# Patient Record
Sex: Male | Born: 2000 | Hispanic: No
Health system: Western US, Academic
[De-identification: ages and names within clinical notes are randomized; demographics above are authoritative.]

---
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
## 2020-04-17 ENCOUNTER — Other Ambulatory Visit (HOSPITAL_COMMUNITY): Payer: Self-pay

## 2020-04-18 ENCOUNTER — Other Ambulatory Visit (HOSPITAL_COMMUNITY): Payer: Self-pay

## 2020-04-19 ENCOUNTER — Other Ambulatory Visit (HOSPITAL_COMMUNITY): Payer: Self-pay

## 2020-04-20 ENCOUNTER — Other Ambulatory Visit (HOSPITAL_COMMUNITY): Payer: Self-pay

## 2020-04-23 ENCOUNTER — Other Ambulatory Visit (HOSPITAL_COMMUNITY): Payer: Self-pay

## 2020-04-29 ENCOUNTER — Other Ambulatory Visit (HOSPITAL_COMMUNITY): Payer: Self-pay

## 2020-05-03 ENCOUNTER — Other Ambulatory Visit (HOSPITAL_COMMUNITY): Payer: Self-pay

## 2020-05-12 ENCOUNTER — Other Ambulatory Visit (HOSPITAL_COMMUNITY): Payer: Self-pay

## 2020-05-12 IMAGING — CT CT Brain W-O Contrast
3 of 4 series · 16 of 47 positions shown, 19 images · non-contrast
Comparison: [DATE].

CT Brain W-O Contrast
INDICATION: TBI (traumatic brain injury) TBI (traumatic brain injury).                   
 Pertinent History: Follow up for head injury with bleed                                   
 Surgical History:craniotomy                                                               
 Cancer: None                                                                              
 Technologist Comments: None.
TECHNIQUE: Helical acquisition with sagittal and coronal reformats.                       
 Utilized dose reduction techniques include: Vendor specific iterative                     
 reconstruction technique

[Series 2: head 3mm stnd · axial · 0.49mm/px · z∈[-114,+28]mm · 10 of 55 slices shown, 13 images]
[im 4/55  brain]
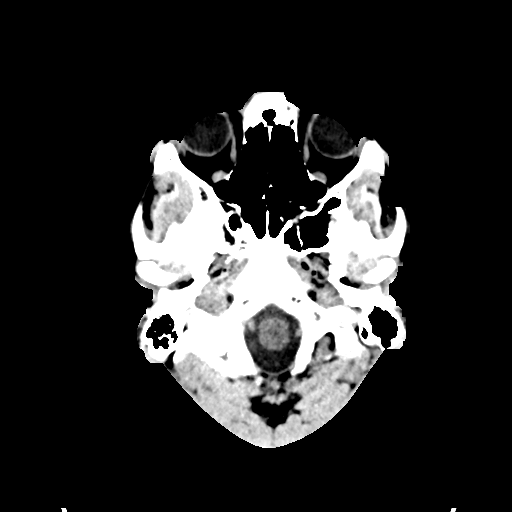
[im 4/55  bone]
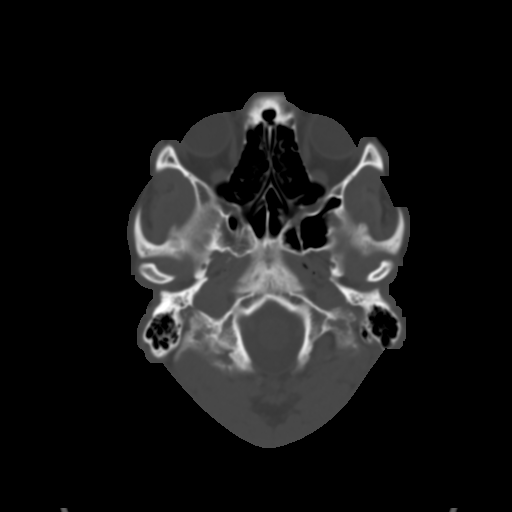
[im 8/55  brain]
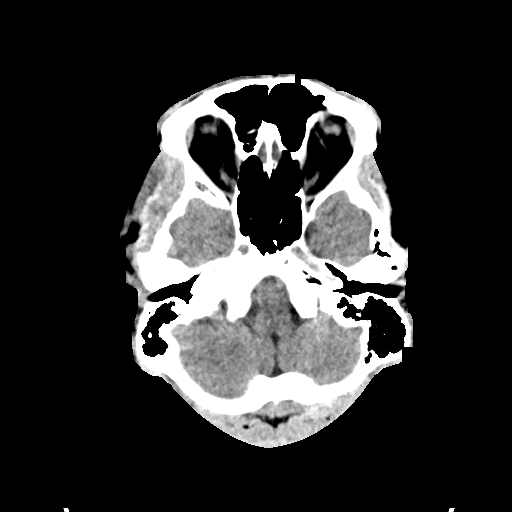
[im 16/55  brain]
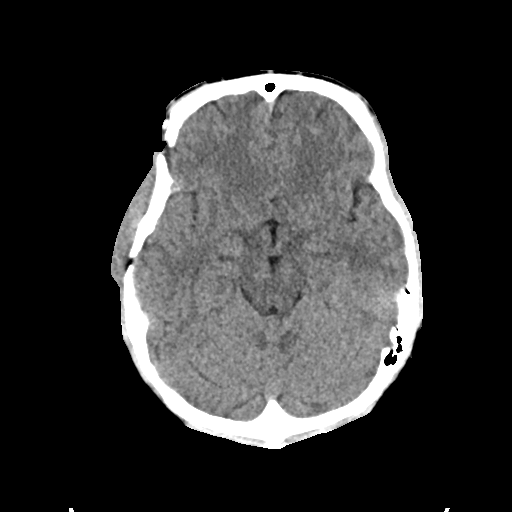
[im 20/55  brain]
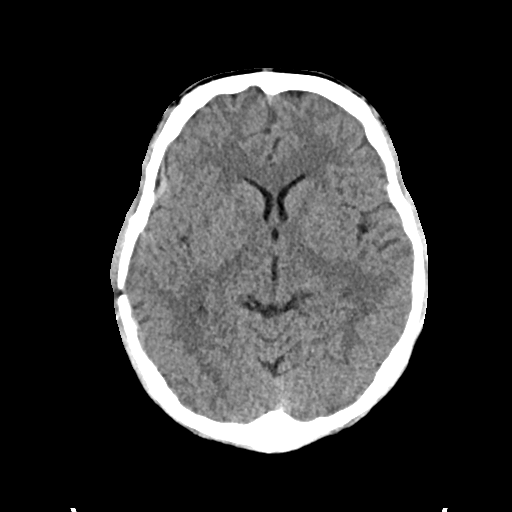
[im 24/55  brain]
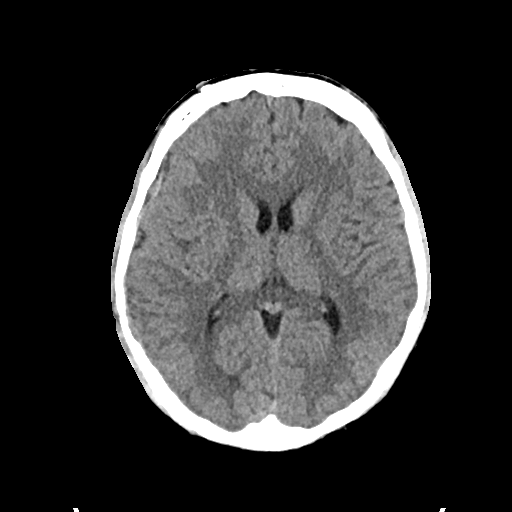
[im 24/55  bone]
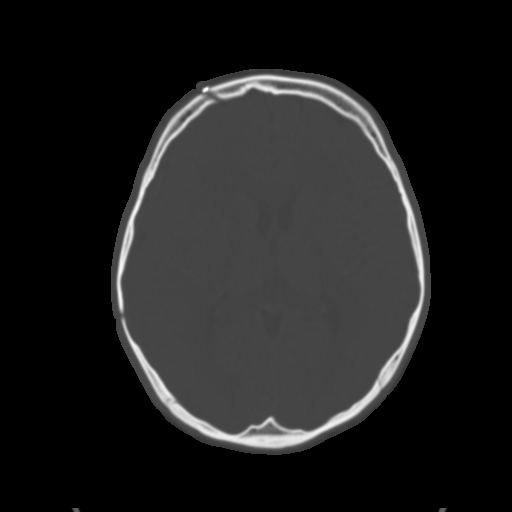
[im 31/55  brain]
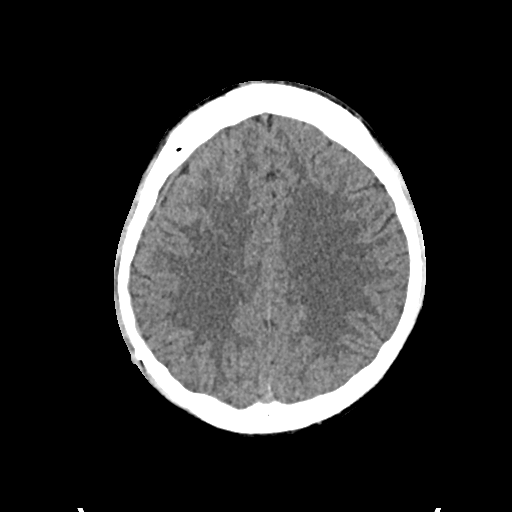
[im 35/55  brain]
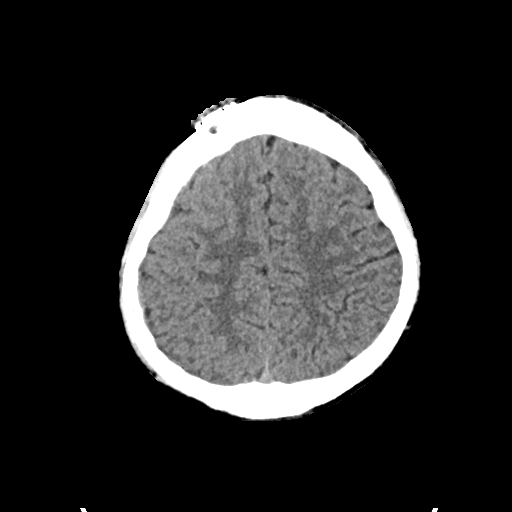
[im 39/55  brain]
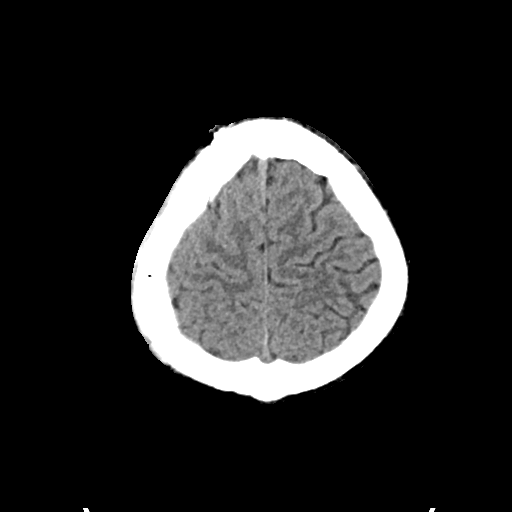
[im 47/55  brain]
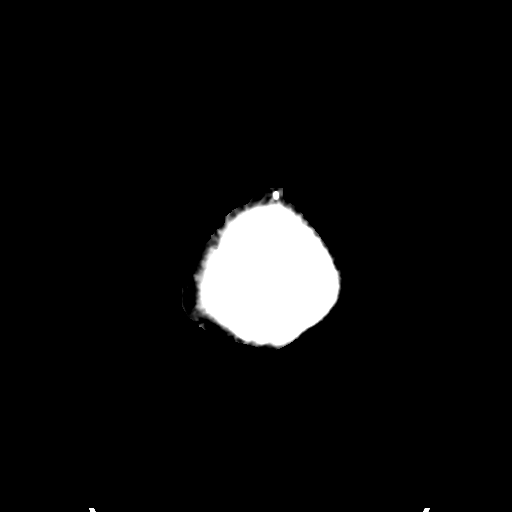
[im 47/55  bone]
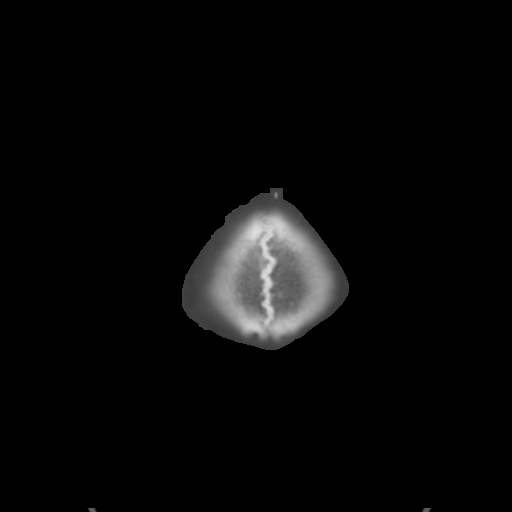
[im 51/55  brain]
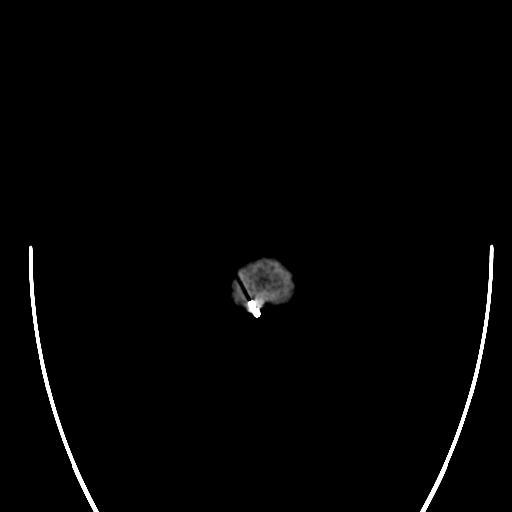

[Series 4: cor 3x3 · coronal · 0.49mm/px · 3 of 73 slices shown]
[im 25/73  brain]
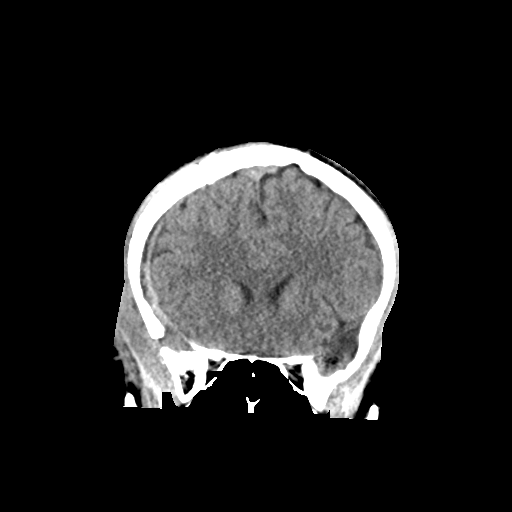
[im 33/73  brain]
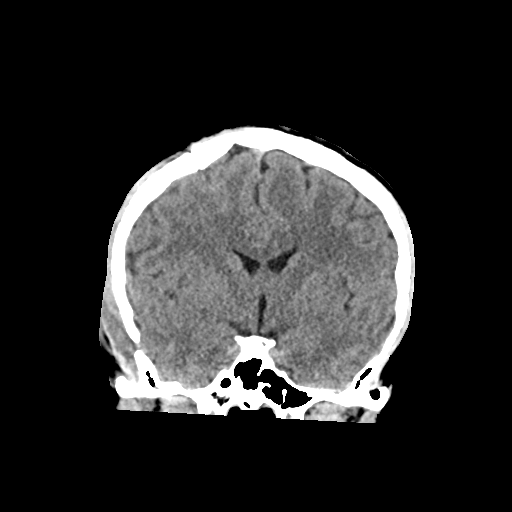
[im 41/73  brain]
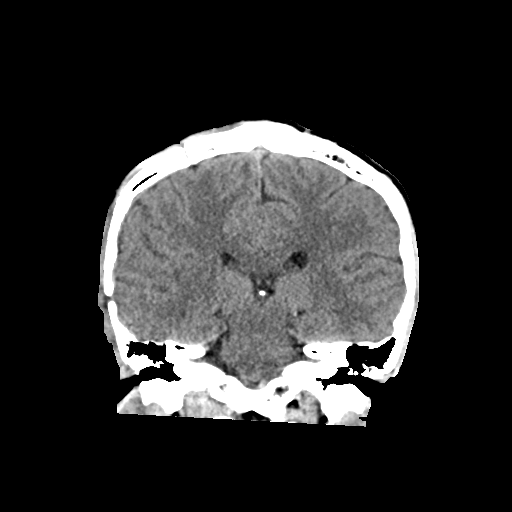

[Series 5: sag 3x3 · sagittal · 0.49mm/px · 3 of 65 slices shown]
[im 22/65  brain]
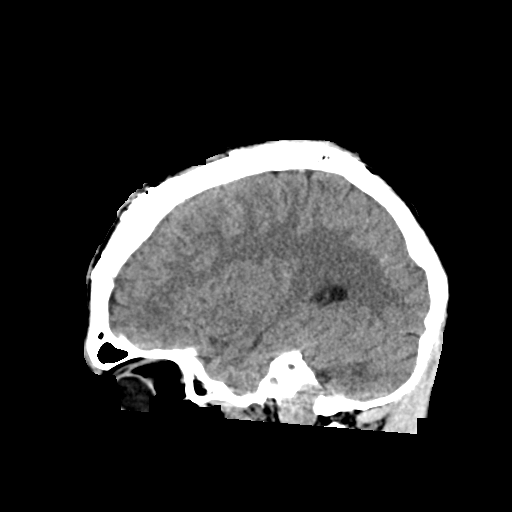
[im 33/65  brain]
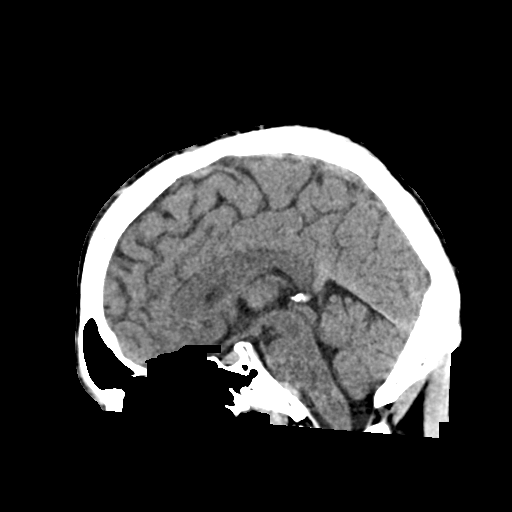
[im 43/65  brain]
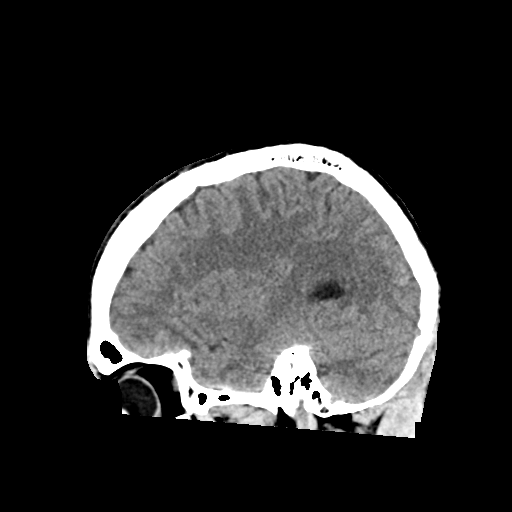

[16 of 47 positions shown; findings below may reference images not displayed]

FINDINGS: There is no significant midline shift. The ventricles and sulci are             
 normal in size for age. The basal cisterns are patent. No acute mass or mass              
 effect is seen. There is no CT evidence of an acute large territory infarct.              
 There is no acute intracranial hemorrhage.                                                
 There is a trace amount of fluid within the dependent portion of the imaged               
 right mastoid. The paranasal sinuses demonstrate mild mucosal thickening within           
 a ethmoid air cell. The left mastoid air cells are clear.                                 
 There has been a right frontotemporoparietal craniotomy. There is                         
 improved                                                                                  
 pneumocephalus. There is soft tissue swelling at the right scalp, this may be             
 slightly increased. The right sided drains have been removed. The a small amount          
 of right extra-axial hemorrhage is again noted. This is best seen on image #24,           
 series 6.
IMPRESSION: No acute findings.

## 2020-05-14 ENCOUNTER — Telehealth (HOSPITAL_BASED_OUTPATIENT_CLINIC_OR_DEPARTMENT_OTHER): Payer: Self-pay

## 2020-05-14 NOTE — Telephone Encounter (Signed)
RETURN CALL: Voicemail - Detailed Message      SUBJECT:  General Message     MESSAGE: Patients Dr. Richardson Chiquito from [facility] [place], [place]. Sent a referral for patient 03.17.2022 for overseeing patients therapy. Patients mother wants to know has the referral been received in office? Patient's Family is requesting Dr. Vicenta Aly

## 2020-05-14 NOTE — Telephone Encounter (Signed)
RETURN CALL: Voicemail - Detailed Message      SUBJECT:  General Message     MESSAGE: patients mother called to inquire about his referral to Neurosurgery wanting to know the soonest he can get scheduled? She would like to see Dr. Erby Pian if possible.

## 2020-05-18 NOTE — Telephone Encounter (Addendum)
Pt's mom is calling to see about scheduling per referral.    Pt's mother also said Dr. Mammie Lorenzo sent a referral directly to Dr. Yehuda Mao to see this patient. Referral sent 05/16/20/    [facility] unable to appoint as pt's symptoms are not listed in PST.     Please call pt's mother Lorene Dy [phone].

## 2020-05-18 NOTE — Telephone Encounter (Signed)
RETURN CALL: Voicemail - Detailed Message      SUBJECT: Urgent per caller   2nd request   Appointment Request     REASON FOR VISIT:     Unspecified intracranial injury with loss of consciousness of unspecified duration, initial encounter    PREFERRED DATE/TIME: asap    ADDITIONAL INFORMATION:     As per mother, they need to schedule asap as they are running into a time issue and are hoping to have an appointment by 06/17/2020 with Dr Jana Hakim.    Thank you

## 2020-05-19 NOTE — Telephone Encounter (Signed)
No patient contact made. Scheduling instructions were added to the referral for [facility] or clinic schedulers.    Faxed request for additional information to be faxed for review. Pt already scheduled for therapy at [facility]. No records available in care everywhere.

## 2020-05-19 NOTE — Telephone Encounter (Signed)
RETURN CALL: Voicemail - General Message      SUBJECT:  General Message     MESSAGE:       Lorene Dy mother requesting to know if Ty received the images.      Thank you

## 2020-05-24 ENCOUNTER — Telehealth (HOSPITAL_BASED_OUTPATIENT_CLINIC_OR_DEPARTMENT_OTHER): Payer: Self-pay

## 2020-05-24 NOTE — Telephone Encounter (Signed)
RETURN CALL: Voicemail - General Message      SUBJECT:  General Message     MESSAGE: Pt's mother is leaving a message for Ty to find out if images that were being mailed were received, and if the appointment with Dr. Erby Pian will be scheduled for tomorrow as anticipated.

## 2020-05-25 ENCOUNTER — Telehealth: Payer: Self-pay

## 2020-05-25 NOTE — Telephone Encounter (Signed)
RETURN CALL: Voicemail - Detailed Message      SUBJECT:  General Message     MESSAGE: Patients mom called asking for Ty to call her back.    Patients mom has spoken with him about tentative appointment today and wants to know if it is confirmed.

## 2020-05-26 ENCOUNTER — Encounter (HOSPITAL_BASED_OUTPATIENT_CLINIC_OR_DEPARTMENT_OTHER): Payer: Self-pay | Admitting: Unknown Physician Specialty

## 2020-05-26 ENCOUNTER — Ambulatory Visit: Payer: PRIVATE HEALTH INSURANCE | Attending: Unknown Physician Specialty | Admitting: Unknown Physician Specialty

## 2020-05-26 ENCOUNTER — Other Ambulatory Visit (HOSPITAL_COMMUNITY): Payer: Self-pay

## 2020-05-26 ENCOUNTER — Encounter (HOSPITAL_BASED_OUTPATIENT_CLINIC_OR_DEPARTMENT_OTHER): Payer: Self-pay | Admitting: Rehabilitative and Restorative Service Providers"

## 2020-05-26 DIAGNOSIS — S069X9S Unspecified intracranial injury with loss of consciousness of unspecified duration, sequela: Secondary | ICD-10-CM | POA: Insufficient documentation

## 2020-05-26 DIAGNOSIS — X58XXXD Exposure to other specified factors, subsequent encounter: Secondary | ICD-10-CM | POA: Insufficient documentation

## 2020-05-26 DIAGNOSIS — S069X9D Unspecified intracranial injury with loss of consciousness of unspecified duration, subsequent encounter: Secondary | ICD-10-CM | POA: Insufficient documentation

## 2020-05-26 NOTE — Telephone Encounter (Signed)
error 

## 2020-05-26 NOTE — Progress Notes (Signed)
[facility]  Portions of this chart may have been created with speech recognition software.    Distant Site Telemedicine Encounter    I conducted this encounter from My Home via secure, live, face-to-face video conference with the patient. Derek Elliott was located at Home alone.  I reviewed the risks and benefits of telemedicine as pertinent to this visit and the patient agreed to proceed.    CC: Head injury  Referred by: Mammie Lorenzo, MD  Accompanied by: none  History obtained from: Patient, review of EMR     HPI: Derek Elliott is a 20 year old LHD male who presents to clinic for evaluation of a head injury.    Date: 04/17/20  Mechanism: Wrestling - details unclear   Immediate neurologic symptoms: Found down by roommates, brought to [facility]  Initial imaging findings: Right subdural hemorrhage with midline shift, extra-axial hemorrhage, left anterior temporal and frontal lobe hemorrhagic contusion, tSAH, s/p decompressive craniectomy, s/p cranioplasty 3/2  PTA: 1-2 days     Reports he is pretty much back to his baseline.     Mobility/dizziness/balance: Denies issues   Cognition: No issues. He has been doing 7-8 hours of cognitive work per day, working with several startups.   Sleep: Sleeping well from ~ 12am-9am  Pain: Denies headaches   Activity: Activity precautions from Neurosurgeons for another 3 weeks. He is tolerating walking, going up/down stairs.   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 On medical leave.     Denies weakness, numbness, tingling. Initially had some L hand tingling but improving.     Prior head injuries: None     Social History: Currently in [place], [place] staying with his parents   Past Medical History: Denies      Outpatient Medications Prior to Visit   Medication Sig Dispense Refill    Cetirizine HCl (ZYRTEC ALLERGY) 10 MG Oral Tab Take 10 mg by mouth daily as needed for allergies.       No  facility-administered medications prior to visit.       Review of patient's allergies indicates:  Allergies   Allergen Reactions    Kiwi Extract Skin: Hives       Physical Exam:  Gen: NAD, comfortable  Pulm: Non-labored respirations, on room air, normal effort  Skin: No visible rashes or lesions  Psych: Cooperative, appropriate, pleasant     Neuro: Alert, speech is fluent with normal comprehension, no dysarthria, aphasia or dysphonia  Registration: Able to repeat 5/5 words after 1 trial.    Digit span: Repeated 5 digits forward, 3 digits backwards  Sustained attention: Serial 7s correct x5, listed months of the year backwards with 1 error, listed days of the week backwards correctly  Delayed recall: 4 of 5 words after 5 minutes.  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 He has recovered very well. He  denies symptoms today and feels at his baseline. He has upcoming visits with PT, OT and SLP. Anticipate he will be ready to return to school in the fall. Do not think he will need neuropsychological evaluation but will discuss his progress with therapy team. He did not have any other concerns for me today.     Follow up: as needed     Dolores Hoose, MD

## 2020-05-27 ENCOUNTER — Encounter (HOSPITAL_BASED_OUTPATIENT_CLINIC_OR_DEPARTMENT_OTHER): Payer: Self-pay | Admitting: Rehabilitative and Restorative Service Providers"

## 2020-05-27 ENCOUNTER — Ambulatory Visit
Payer: PRIVATE HEALTH INSURANCE | Attending: Rehabilitative and Restorative Service Providers" | Admitting: Speech-Language Pathologist

## 2020-05-27 ENCOUNTER — Encounter (HOSPITAL_BASED_OUTPATIENT_CLINIC_OR_DEPARTMENT_OTHER): Payer: Self-pay

## 2020-05-27 ENCOUNTER — Ambulatory Visit (HOSPITAL_BASED_OUTPATIENT_CLINIC_OR_DEPARTMENT_OTHER): Payer: PRIVATE HEALTH INSURANCE | Admitting: Rehabilitative and Restorative Service Providers"

## 2020-05-27 DIAGNOSIS — S069XAA Unspecified intracranial injury with loss of consciousness status unknown, initial encounter: Secondary | ICD-10-CM | POA: Insufficient documentation

## 2020-05-27 DIAGNOSIS — Z789 Other specified health status: Secondary | ICD-10-CM | POA: Insufficient documentation

## 2020-05-27 DIAGNOSIS — S069X9S Unspecified intracranial injury with loss of consciousness of unspecified duration, sequela: Secondary | ICD-10-CM

## 2020-05-27 DIAGNOSIS — R41841 Cognitive communication deficit: Secondary | ICD-10-CM | POA: Insufficient documentation

## 2020-05-27 DIAGNOSIS — Z7409 Other reduced mobility: Secondary | ICD-10-CM | POA: Insufficient documentation

## 2020-05-27 DIAGNOSIS — X58XXXD Exposure to other specified factors, subsequent encounter: Secondary | ICD-10-CM | POA: Insufficient documentation

## 2020-05-27 NOTE — Progress Notes (Signed)
[facility] PHYSICAL THERAPY INITIAL PLAN OF CARE          L & I Claim #: N/A    Certification From*: 05/28/20  Certification To*: 06/27/20  ___ Discontinue Therapy Services    Date of Symptom Onset*: 04/17/20  Start of Care Date*: 05/28/20  VISITS FROM SOC: 1  TOTAL DSHS UNITS TO DATE: 0    Referring Provider: Janese Banks MD; now followed by Dolores Hoose MD  Interpreter Status: Not Needed    Reason for Referral*: TBI  History of Present Illness: 20 y/o male found down by roommates and brought to the hospital 04/17/20 where he was found to have R subdural hemorrhage with midline shift, extraaxial hemorrhage, left anterior temporal and frontal hemorrhagic contusions, tSAH s/p decompressive craniectomy and eventual cranioplasty 04/28/20.  Pertinent Medical/Surgical History:   No past medical history on file.  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 He has an internship that starts June 4th and is a few blocks away.  Current / Past Rehabilitation: [facility] in [place]  Prior Level of Function  Prior Function Comments*: Independent with mobility and ADL's     Precautions:    Precautions: Lifting 10lb restriction    , no intense exercise until April 20th.                  Fall Screening:  Do you worry about falling?: No  Have you fallen in the past year?: Yes  Do you feel unsteady when standing or walking?: No    SUBJECTIVE:  Patients Statement: Fayette reports that he was having headaches up until about a week ago. 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 He's spending about 8 hours working on the computer. He has been walking up and down stairs at home and feels like he's deconditioned. He has been playing "indoor basketball" with his younger brother. For the first few weeks after his injury he had some numbness and tingling in a median nerve distribution on his left hand. He is left handed. Now he feels like he's about 90% recovered.   Pain Score: 0 - No pain  Patient  Stated Goals: Be able to return to driving, lifting weights, running, exercising at a high level, eventually return to sports    OBJECTIVE:  Vital signs: In sitting BP 137/80 HR 100    Range of Motion: Grossly WFL    Posture: Slight forward head with rounded shoulders in resting position    Strength:    L R   Shoulder flexion 5 5   Shoulder abduction 5 5   Elbow flexion 5 5   Elbow extension 5 5   Hip flexion 5 5   Knee extension 5 5   Knee flexion 5 5   Ankle dorsiflexion 5 5   Ankle plantarflexion 5 (able to do 10 full heel raises) 5 (able to do 10 full heel raises)     Sensation: Mild hypersensitivity in median nerve distribution on left hand; otherwise no impairment    Coordination: Finger-to-nose normal, rapid alternating movements at BUE and BLE, heel-to-shin normal bilaterally    Tone: No hypertonicity noted     Sitting Balance: No impairment    Standing Balance: no impairment observed    Vestibular:  Pt reports initially feeling some difficulty with blurry vision with prolonged screen time but denies that currently    Special Tests: HiMAT: HIGH LEVEL MOBILITY ASSESSMENT TOOL  ITEM  PERFORMANCE POINTS   WALK 3.6s 4   WALK BACKWARD 3.81s 4   WALK ON TOES 4.82s 4   WALK OVER OBSTACLE 4.25s 4   RUN 2.24s 2   SKIP 3.81s 3   HOP FORWARD (AFFECTED) 3.25s 4   BOUND (AFFECTED) 1) 203 cm 4   BOUND (LESS-AFFECTED) 1) 228 cm 4   UP STAIRS DEPENDENT (Rail OR not reciprocal: if not, score 5 and rate below)  4   UP STAIRS INDEPENDENT (No rail AND reciprocal: if not score 0 and rate above) 4.12s (converted from 11 steps) 5   DOWN STAIRS DEPENDENT (Rail OR not reciprocal: if not score 5 and rate below)  4   DOWN STAIRS INDEPENDENT (No rail AND reciprocal: if not score 0 and rate above) 3.57s (converted from 11 steps) 5     TOTAL HiMAT SCORE: 51/54      HiMat Normative Data:    Healthy 77-25 year olds: Cloria Spring al, 2009; n = 45; age range = 18-25)  50-54 points for males (Median score of 54 points)   44-54 points for  females (Median score of 51 points)   Moderate effect size between males and females (r = 0.59)    Transfers: Independent    Ambulation:    Distance: >100'   Level of Assist: Independent with no device   Deviations: No impairment observed   10-meter speed: 3.6 seconds (2.78 m/s)    Stairs: Up/down without handrail independently via reciprocal pattern    Equipment:  None      INTERVENTION:    Evaluation Code 19622 x 50 minutes    Intervention 1- Therapeutic activities x 10 minutes:   Discussed with Gregary Signs and his mother recommendations for returning to exercise once cleared by neurosurgeon. Advised him to perform cardiovascular exercise at a moderate intensity with guideline that he be able to carry on a conversation still. Also discussed recommendation to not worsen any symptoms (fatigue, headache, eye strain, etc) by more than 2 points on a 10 point scale. Finally, edu to [place] and Lorene Dy that for every day pt spends in a hospital bed, he should expect 3 days to return to that level of endurance.    Education:    Primary learner: patient and his mother, Lorene Dy  Preferred learning style:  demonstration, verbal and self-directed practice  Barriers to learning: none   Cultural practices that influence care: none   Topic taught: Role of PT, discharge recommendations, return to exercise parameters   Response to education: [place] and Lorene Dy verbalized understanding       ASSESSMENT: Pt presents with the following:  Comorbidities and Personal Factors affecting plan of care: None  Impairments: Decreased activity tolerance  Activity Limitations: None identified  Participation Restrictions: 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 Claxton Levitz is a 20 year old man referred to [facility] following a TBI sustained in February 2022. Christoph has  made a remarkable recovery thus far and his High level Mobility Assessment Tool score of 51/54 is within normal limits for a man of his age. His only noted impairment is decreased activity tolerance but this is not surprising given his month long hospital course. Assessment of his strength, range of motion, coordination and reaction time do not reveal any concerns from a physical mobility standpoint to him  returning to driving but Art is aware that this final recommendation must come from a physician. At this time, Terel does not have any PT need and will be discharged with the recommendations for a gradual resumption of physical activity once cleared to do so.     Fall risk based on assessment: none     Rehabilitation potential is: Excellent    GOALS:  No PT goals at this time. Pt will be discharged and he and his mother are aware and in agreement with this plan.    Donnamarie Rossetti PT DPT NCS  Physical Therapist  [facility]  [facility]    Physician's Statement of Concurrence:  By signing, I have reviewed and concur with the above therapy evaluation.

## 2020-05-27 NOTE — Progress Notes (Signed)
Derek Elliott PLAN OF CARE/DISCHARGE  L&I Claim #:  N/A    Certification From*: 77/41/28  Certification To*: 78/67/67   ___Discontinue Therapy Services    Date of Symptom Onset*: 04/17/20  Start of Care Date*: 05/27/20  Visits from [facility]:  1    Referring Provider:  Bryon Lions, MD; now followed by Derek Portela, MD  Interpreter Status: Not Needed  Pt accompanied to appt by parents Derek Elliott and Derek Elliott.     Reason for Referral*: Cognitive communication  History of Present Illness:  20 y/o male found down by roommates and brought to the hospital where he was found to have R subdural hemorrhage with midline shift, extraaxial hemorrhage, left anterior temporal and frontal hemorrhagic contusions, tSAH s/p decompressive craniectomy and eventual cranioplasty 04/28/20.  Pertinent Medical/Surgical History:  No past medical history on file.  Background/Social History:  Attending school at [employer]; staying with parents in [place].   Education: Sophomore in [place]: works in [employer]:  Acute care therapies; now followed by [name] PT/OT.   Prior Level of Function:  Independent      Precautions:    Precautions: Neurosurgery precautions; see Dr. Marilynne Elliott note         Fall Screening:  Have you fallen in the past year?:  (Scheduled for PT)      SUBJECTIVE:  Patients Statement:  Pt reported feeling largely back to baseline. Noted his energy has improved "tremendously" and his vision is improving (occasional blurred vision with screen time but tested at PCP and was 20/20). He noted he can work for 2 hours at a time, take a 15 minute break and return to work. 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   Pain Score:  (No pain reported)  Patient's Stated Goals:  Return to school; return to  driving.      OBJECTIVE:  Behavioral Observations:  Pleasant and cooperative  Auditory Comprehension:  WNL in conversation and for following task instructoins  Motor Speech:  WNL  Intelligibility:  100%    Voice:  WNL  Verbal Expression:  WNL  Verbal Fluency: RBANS-b Semantic Fluency = 20 (34%ile; distracted mid-task by dad coming into the office)  Reading Comprehension:  Adequate for FAVRES task  Written Expression:  Adequate for FAVRES task   Pragmatic Communication:  WNL  Memory:  RBANS-b List Learning =31 (53%ile); Story Memory = 15 (11%ile); List Recall = 6 (21%ile); List Recognition = 19/20 (13%ile); Story Recall = 5 (<1%ile); Figure Recall = 20 (91-92%ile)  Executive-Functions:  FAVRES Task 2: Accuracy = 4 (17%ile; pt explained that he had calculated delivery time into project completion but was able to self-correct error)  Problem Solving:  See above  Attention:  RBANS-b Digit Span = 12 (55%ile); Coding = 65 (84%ile)  Visual-Spatial Skills:  RBANS-b Figure Copy = 20 (75%ile); Line Orientation = 19 (77%ile)      Education:   Primary learner:  Patient and parents   Preferred learning style:  written and verbal   Barriers to learning:  none   Cultural practice that influence care:  none   Topic taught:    o Results of evaluation, plan of care  o Discussion re: free online courses to maximize targeting of auditory attention/memory for  school  o Discussed benefits/drawbacks of completion of summer internship, defer to pt/family to make that decision knowing strengths and challenges    Response to education:  Verbalized understanding       ASSESSMENT:  Pt presented with mild deficits in immediate recall of cohesive story and lower than anticipated scores on delayed recall of list and story tasks. He scored high on visual recall which is baseline reported strength for him. At this time he is challenging himself by working with four different startups and expressed interest in free online summer course as  well. He has no current goals with SLP but is encouraged to return if difficulties arise in the future as he continues to progress his cognitive home program.      GOALS:    Discharge Goals:  Pt will verbalize understanding of results of evaluation and POC. MET      The above goals and plan of care have been discussed and agreed upon by the patient and his parents.      PLAN:  Discharge from [facility].       Derek [place], MS, [place]  Speech Pathology  [facility])  Department of Rehabilitation Medicine

## 2020-05-27 NOTE — Progress Notes (Signed)
[facility] OCCUPATIONAL THERAPY INITIAL PLAN OF CARE          L & I Claim #: N/A    Certification From*: 05/27/20  Certification To*: 05/27/20  _X_ Discontinue Therapy Services    Date of Symptom Onset*: 04/17/20  Start of Care Date*: 05/27/20  VISITS FROM [facility]: 1  TOTAL [facility] UNITS TO DATE: 0    Referring Provider: Dolores Hoose, MD  Interpreter Status: Not Needed    Reason for Referral*: OT evaluation and management  History of Present Illness: Derek Elliott is a 20 year old LHD male who presents to clinic for evaluation of a head injury.    Date: 04/17/20  Mechanism: Wrestling - details unclear   Immediate neurologic symptoms: Found down by roommates, brought to [facility] in [facility]  Initial imaging findings: Right subdural hemorrhage with midline shift, extra-axial hemorrhage, left anterior temporal and frontal lobe hemorrhagic contusion, tSAH, s/p decompressive craniectomy, s/p cranioplasty 3/2    Pertinent Medical/Surgical History:   No past medical history on file.  Social History: Pt. Is a sophomore at [employer], IT sales professional. Pt. Currently staying with his parents here in his parents house in [place], [place].  Current / Past Rehabilitation: Received Acute therapies in [place].  Prior Level of Function  Level of Independence: Independent with ADLs and functional transfers;Independent with homemaking with ambulation  Lives With: Other (Comment) (Pt. 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 Express that he is back to baseline and would like to know if this session can address his  ability to return to drive.     Patient Stated Goals: Pt. Stated that he would like to be able to return to school and return to drive    OBJECTIVE:  Posture: WFL    Integumentary: Grossly Intact    Tone: B UE: WFL    Sensation: Pt.'s sensation is grossly intact but there is mild hypersensitivity on the tips of his left thumb and index fingers    Range of Motion: B UE: WNL    Strength: B UE: 5/5    Hand Dominance: Left Hand dominant    Grip/Pinch:  Per clinical observation: WFL    Coordination: Per clinical observation: WFL    Functional Mobility: Independent  Toilet Transfer: Solicitor: Independent  Endurance: Good    Equipment: None    Cognition/Visual Perceptual  Attention: WFL  Orientation: Alert and Oriented x 3  Memory: Not formally assessed due to time constraint. Please see SLP notes.  Executive Functions: Not formally assessed due to time constraint. Please see SLP notes.  Vision: [facility]  Visual Perception: WFL      Basic Activities of Daily Living   Eating: Independent  Grooming: Independent  Bathing: Independent  UB Dressing: Independent  LB Dressing: Independent  Toileting: Independent      Instrumental Activities of Daily Living  Medication Management: Supervision   Meal Preparation: Currently assisted by his parents  Grocery Shopping: Currently assisted by his parents  Cleaning/Laundry: Currently assisted by his parents  Money Management: Independent  Phone:Independent  Reading/Writing: Tour manager Use: Independent  Driving/Transportation:currently assisted by his parents    OT Driver Off-Road Assessment Battery (OT-DORA)    The OT-DORA is a screening tool to evaluate if an individual is appropriate for an on-road driving test. The screen is used as an efficient tool to assess an individuals cognitive, perceptual, behavioral, physical, and sensory skills and abilities that are related to driving, prior to an on-road assessment.      Score Criterion    Trails A 20 secs Avg  Time: 29 seconds  Deficient: >758seconds   Trails B 53 secs Avg Time: 75 seconds  Deficient: >273 seconds    Road Sign Recognition 15/15    Road [employer] Craft Test 30/30 A score <20 indicates client may have difficulty knowing road laws and having road craft skills   Drive Home Maze 15 secs Clients who take longer than 100 seconds to complete task may have difficulties successfully completing on-road assessments   Bells Test 35/35 More than 3 omissions is cause for suspected attentional deficit. Greater than 6 omisisons is cause for suspected visual neglect.    Right Heel Pivot Test 6 secs >8 seconds suggest reduced heel pivot skill that could affect driving.    Simulated Accelerator-Brake Test 0 One or more error suggests client may be at risk of using the right foot to accelerate and left foot to brake in tandem or simultaneously during driving.          INTERVENTION:    Evaluation Code 92119 x 15 minutes    Intervention 1- Self-Care x 45 minutes:   -Completed OT DORA. See details above.  -Initiated Visual Screen using part of the BIVABA:  A. Red Dot Confrontation Test: WNL  B. Smooth Pursuits: WNL  C. 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 Has no major physical impairments noted during this evaluation.  Activity Limitations: Pt. Is currently limited to complete some IADLs such as driving and return to work due to recent injury.  Participation Restrictions: Pt. 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  Level of modification of task or assistance needed: None  Clinical Decision Making Complexity:Low  Pt will not benefit from outpatient OT at this time since Pt. Demonstrate that he is performing within functional limits with his BADLs and IADLs at home. Pt. Demonstrate no risk safety factors with return to driving based on the off road driving screening tool using the OT-DORA completed today. Pt. Will be discharged from OT skilled services.    Rehabilitation potential is: Excellent    Potential Barriers to achieve rehab goals: None    Care Connections Functional Index Score: Outcome Score:  (N/A)       GOALS:    The above goals and plan of care have been discussed and agreed upon by the patient and his family.    PLAN:  Anticipated discharge date: 05/28/2019    Montel Clock, OTR/L  Occupational Therapist  [facility])  Department of Rehabilitation Medicine      Physician's Statement of Concurrence:  By signing, I have reviewed and concur with the above therapy evaluation.

## 2020-05-28 ENCOUNTER — Ambulatory Visit
Payer: PRIVATE HEALTH INSURANCE | Attending: Rehabilitative and Restorative Service Providers" | Admitting: Rehabilitative and Restorative Service Providers"

## 2020-05-28 ENCOUNTER — Encounter (HOSPITAL_BASED_OUTPATIENT_CLINIC_OR_DEPARTMENT_OTHER): Payer: Self-pay

## 2020-05-28 DIAGNOSIS — R41841 Cognitive communication deficit: Secondary | ICD-10-CM | POA: Insufficient documentation

## 2020-05-28 DIAGNOSIS — X58XXXD Exposure to other specified factors, subsequent encounter: Secondary | ICD-10-CM | POA: Insufficient documentation

## 2020-05-28 DIAGNOSIS — Z789 Other specified health status: Secondary | ICD-10-CM | POA: Insufficient documentation

## 2020-05-28 DIAGNOSIS — S069X9S Unspecified intracranial injury with loss of consciousness of unspecified duration, sequela: Secondary | ICD-10-CM | POA: Insufficient documentation

## 2020-05-28 DIAGNOSIS — Z7409 Other reduced mobility: Secondary | ICD-10-CM | POA: Insufficient documentation

## 2020-05-31 NOTE — Telephone Encounter (Signed)
Patient's mother calling again to find out if images were received. Asking specifically for Ty. Please further assist. Thanks.

## 2020-06-03 ENCOUNTER — Encounter (HOSPITAL_COMMUNITY): Payer: Self-pay

## 2020-06-09 ENCOUNTER — Encounter (HOSPITAL_BASED_OUTPATIENT_CLINIC_OR_DEPARTMENT_OTHER): Payer: Self-pay | Admitting: Unknown Physician Specialty

## 2020-06-15 ENCOUNTER — Encounter (HOSPITAL_BASED_OUTPATIENT_CLINIC_OR_DEPARTMENT_OTHER): Payer: Self-pay | Admitting: Neurological Surgery

## 2020-06-15 ENCOUNTER — Telehealth (HOSPITAL_BASED_OUTPATIENT_CLINIC_OR_DEPARTMENT_OTHER): Payer: Self-pay | Admitting: Neurological Surgery

## 2020-06-15 NOTE — Telephone Encounter (Signed)
RETURN CALL: Voicemail - Detailed Message      SUBJECT:  Appointment Request     REASON FOR VISIT: per referral  PREFERRED DATE/TIME: ASAP  ADDITIONAL INFORMATION: Patient has CT scans now and are ready to reschedule this appointment.  Patient's mother states that Dr. Mammie Lorenzo has already spoken with Dr. Fonnie Jarvis and their instructions were to wait for an email but patient's mother has not received that yet.  Please follow up with patient's mother for prompt scheduling.

## 2020-06-22 ENCOUNTER — Ambulatory Visit: Payer: PRIVATE HEALTH INSURANCE | Attending: Neurological Surgery | Admitting: Neurological Surgery

## 2020-06-22 VITALS — BP 136/78 | HR 79 | Ht 73.0 in | Wt 183.0 lb

## 2020-06-22 DIAGNOSIS — W19XXXA Unspecified fall, initial encounter: Secondary | ICD-10-CM | POA: Insufficient documentation

## 2020-06-22 DIAGNOSIS — S069X0A Unspecified intracranial injury without loss of consciousness, initial encounter: Secondary | ICD-10-CM | POA: Insufficient documentation

## 2020-06-22 NOTE — Progress Notes (Signed)
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  He suffered a traumatic brain injury approximately 10 weeks ago in the beginning of February when he was wrestling with a friend, fell, and hit his head on concrete.  He was diagnosed with a large right-sided parietal temporal epidural hematoma which he underwent craniotomy and evacuation.  He needed approximately 3 weeks of recovery in the hospital and in rehabilitation for this.  He is recently come back to [place] with his family.  He has taken a semester off.  He is here to establish care and review his head CT.  He had a head CT done approximately 6 weeks after the injury which shows resolution of the epidural hematoma along with no other acute neurological findings.  He is making a very nice recovery.  He has been cleared by the rehab physician.  He does have some occasional concussive symptoms which are getting better.  The incision is nicely healed and I have no concerns about this.  We went over standard postoperative care for craniotomy for epidural hematoma.  He the patient and his mom asked many appropriate questions about activity, postoperative care, and expected outlook which I answered to the best my ability.  I do not think he needs any further follow-up with [place] however they can contact us as needed should any issues arise.

## 2020-06-23 NOTE — Progress Notes (Signed)
I Dr. Lisabeth Pick saw and evaluated Derek Elliott (accompanied by his mother) with resident Dr. Waldron Labs over the course of 15 minutes. We have reviewed his prior course and recent imaging. He has recovered to his neurologic baseline. I have encouraged continued progression in his activity (with self limitations should he have headaches etc.) as he had an epidural hematoma and is post-concussive. I have told them I would not limit him but graduated progression of activity as tolerated is warranted. He can follow-up with [place] as needed. He has made a remarkable recovery.

## 2020-07-07 ENCOUNTER — Encounter (HOSPITAL_BASED_OUTPATIENT_CLINIC_OR_DEPARTMENT_OTHER): Payer: Self-pay | Admitting: Unknown Physician Specialty

## 2020-07-16 ENCOUNTER — Ambulatory Visit: Payer: PRIVATE HEALTH INSURANCE | Attending: Unknown Physician Specialty | Admitting: Unknown Physician Specialty

## 2020-07-16 VITALS — Wt 182.0 lb

## 2020-07-16 DIAGNOSIS — R4189 Other symptoms and signs involving cognitive functions and awareness: Secondary | ICD-10-CM | POA: Insufficient documentation

## 2020-07-16 DIAGNOSIS — S069X9S Unspecified intracranial injury with loss of consciousness of unspecified duration, sequela: Secondary | ICD-10-CM

## 2020-07-16 DIAGNOSIS — X58XXXD Exposure to other specified factors, subsequent encounter: Secondary | ICD-10-CM | POA: Insufficient documentation

## 2020-07-16 NOTE — Progress Notes (Signed)
Rehabilitation Medicine Clinic  Portions of this chart may have been created with speech recognition software.    Distant Site Telemedicine Encounter    I conducted this encounter from [facility] via secure, live, face-to-face video conference with the patient. Travian was located at Home alone.  I reviewed the risks and benefits of telemedicine as pertinent to this visit and the patient agreed to proceed.    ID: Verl is a 20 year old LHD male with probable moderate to severe traumatic brain injury sustained 04/17/20.     CC: TBI  Accompanied by: none  History obtained from: Patient, review of EMR     Interval History: Quaran was last seen on 05/26/20.    He has been ramping up his cognitive work at home. Reports decreased concentration and endurance, especially with complex information.  He has not noticed any difficulty with learning or memory, main limitation is cognitive fatigue. He can work for about 30 minutes and then takes a break for 5-15 minutes and returns to work.     He is concerned about being successful once he returns to school.    He also reports having a cough over the past few weeks.  Denies fevers, chills.  He has not taken a COVID test and I recommended that he do so.    Physical Exam:  Gen: NAD, comfortable  Pulm: Non-labored respirations, on room air, normal effort  Skin: No visible rashes or lesions  Psych: Cooperative, appropriate, pleasant   Neuro: Alert, speech is fluent with normal comprehension, no dysarthria, aphasia or dysphonia, answers questions appropriately, extraocular movements grossly intact    Assessment and Plan:  Panfilo is a 20 year old LHD male with probable moderate to severe traumatic brain injury sustained 04/17/20.  He has made a remarkable recovery.  He continues to report cognitive fatigue and decreased endurance.  Today we discussed accommodations that may be helpful when he returns to school.  I will complete paperwork recommending time and a half and quiet environment for  exams, as well as priority when choosing his classes so he can arrange his schedule with ample breaks in between classes.    Advised to follow-up with me as needed.  He can certainly send a message if he has any unexpected difficulties once he returns to college.  I am happy to help plug him in with a local team there.    Dolores Hoose, MD

## 2023-04-23 ENCOUNTER — Emergency Department: Payer: Self-pay

## 6575-08-28 DEATH — deceased
